# Patient Record
Sex: Male | Born: 1980 | Race: Black or African American | Hispanic: No | Marital: Married | Smoking: Former smoker
Health system: Southern US, Community
[De-identification: ages and names within clinical notes are randomized; demographics above are authoritative.]

---

## 2015-08-22 ENCOUNTER — Encounter: Payer: Self-pay | Admitting: *Deleted

## 2015-08-22 DIAGNOSIS — Z87891 Personal history of nicotine dependence: Secondary | ICD-10-CM | POA: Insufficient documentation

## 2015-08-22 DIAGNOSIS — J159 Unspecified bacterial pneumonia: Secondary | ICD-10-CM | POA: Insufficient documentation

## 2015-08-22 MED ORDER — ACETAMINOPHEN 500 MG PO TABS
1000.0000 mg | ORAL_TABLET | Freq: Once | ORAL | Status: AC
  Administered 2015-08-22: 1000 mg via ORAL

## 2015-08-22 MED ORDER — ACETAMINOPHEN 500 MG PO TABS
ORAL_TABLET | ORAL | Status: AC
  Filled 2015-08-22: qty 2

## 2015-08-22 NOTE — ED Notes (Signed)
Pt reports sinus congestion and fever since yesterday.   Pt also has chills and a dry cough.  cig smoker.

## 2015-08-23 ENCOUNTER — Emergency Department: Payer: Self-pay

## 2015-08-23 ENCOUNTER — Emergency Department
Admission: EM | Admit: 2015-08-23 | Discharge: 2015-08-23 | Disposition: A | Discharge status: Home / Self Care | Payer: Self-pay | Attending: Emergency Medicine | Admitting: Emergency Medicine

## 2015-08-23 DIAGNOSIS — J189 Pneumonia, unspecified organism: Secondary | ICD-10-CM

## 2015-08-23 LAB — RAPID INFLUENZA A&B ANTIGENS (ARMC ONLY)
INFLUENZA A (ARMC): NOT DETECTED
INFLUENZA B (ARMC): NOT DETECTED

## 2015-08-23 MED ORDER — LEVOFLOXACIN 500 MG PO TABS: 500.0000 mg | ORAL_TABLET | Freq: Every day | ORAL | Status: AC

## 2015-08-23 MED ORDER — LEVOFLOXACIN 750 MG PO TABS
750.0000 mg | ORAL_TABLET | Freq: Once | ORAL | Status: AC
  Administered 2015-08-23: 750 mg via ORAL
  Filled 2015-08-23: qty 1

## 2015-08-23 NOTE — ED Notes (Signed)
Pt. States he has had cold/flu like symptoms for the past two days.  Pt. States he has been around another family member who has like symptoms.

## 2015-08-23 NOTE — ED Notes (Signed)
Pt discharged home after verbalizing understanding of discharge instructions; nad noted. 

## 2015-08-23 NOTE — Discharge Instructions (Signed)

## 2015-08-23 NOTE — ED Provider Notes (Signed)
[facility] Emergency Department Provider Note  ____________________________________________  Time seen: 5:00 AM  I have reviewed the triage vital signs and the nursing notes.   HISTORY  Chief Complaint Fever and Nasal Congestion     HPI Andrew Garner is a 35 y.o. male presents with sinus congestion cough fever and chills times one day. Patient's abdomen arrival to the emergency department 101.6. Patient admits to quitting smoking tobacco one month ago prior to that patient states he smoked one pack per day. In addition patient admits to multiple sick contacts at home with similar symptoms.     past medical history  None  There are no active problems to display for this patient.   past surgical history  None  No current outpatient prescriptions on file.  Allergies no known drug allergies  No family history on file.  Social History Social History  Substance Use Topics  . Smoking status: Former Games developer  . Smokeless tobacco: None  . Alcohol Use: Yes    Review of Systems  Constitutional: positive for fever. Eyes: Negative for visual changes. ENT: Negative for sore throat. Cardiovascular: Negative for chest pain. Respiratory: Negative for shortness of breath.Positive for cough Gastrointestinal: Negative for abdominal pain, vomiting and diarrhea. Genitourinary: Negative for dysuria. Musculoskeletal: Negative for back pain. Skin: Negative for rash. Neurological: Negative for headaches, focal weakness or numbness.   10-point ROS otherwise negative.  ____________________________________________   PHYSICAL EXAM:  VITAL SIGNS: ED Triage Vitals  Enc Vitals Group     BP 08/22/15 2233 155/88 mmHg     Pulse Rate 08/22/15 2233 100     Resp 08/22/15 2232 20     Temp 08/22/15 2233 101.6 F (38.7 C)     Temp Source 08/22/15 2232 Oral     SpO2 08/22/15 2233 96 %     Weight 08/22/15 2232 350 lb (158.759 kg)     Height 08/22/15 2232 6' (1.829  m)     Head Cir --      Peak Flow --      Pain Score 08/22/15 2233 8     Pain Loc --      Pain Edu? --      Excl. in GC? --      Constitutional: Alert and oriented. Well appearing and in no distress. Eyes: Conjunctivae are normal. PERRL. Normal extraocular movements. ENT   Head: Normocephalic and atraumatic.   Nose: No congestion/rhinnorhea.   Mouth/Throat: Mucous membranes are moist.   Neck: No stridor. Hematological/Lymphatic/Immunilogical: No cervical lymphadenopathy. Cardiovascular: Normal rate, regular rhythm. Normal and symmetric distal pulses are present in all extremities. No murmurs, rubs, or gallops. Respiratory: Normal respiratory effort without tachypnea nor retractions. Breath sounds are clear and equal bilaterally. No wheezes/rales/rhonchi. Gastrointestinal: Soft and nontender. No distention. There is no CVA tenderness. Genitourinary: deferred Musculoskeletal: Nontender with normal range of motion in all extremities. No joint effusions.  No lower extremity tenderness nor edema. Neurologic:  Normal speech and language. No gross focal neurologic deficits are appreciated. Speech is normal.  Skin:  Skin is warm, dry and intact. No rash noted. Psychiatric: Mood and affect are normal. Speech and behavior are normal. Patient exhibits appropriate insight and judgment.  ____________________________________________    LABS (pertinent positives/negatives)  Labs Reviewed  RAPID INFLUENZA A&B ANTIGENS ([facility] ONLY)      RADIOLOGY CT Chest Wo Contrast (Final result) Result time: 08/23/15 07:12:20   Final result by Rad Results In Interface (08/23/15 07:12:20)   Narrative:   CLINICAL  DATA: 35 year old male with cold and flu like symptoms for 2 days. Sick contacts. Abnormal left lung on chest x-ray. Initial encounter.  EXAM: CT CHEST WITHOUT CONTRAST  TECHNIQUE: Multidetector CT imaging of the chest was performed following the standard protocol without IV  contrast.  COMPARISON: Chest x-ray 0509 hours today.  FINDINGS: Large body habitus. 6 cm area of confluent peri-bronchovascular opacity with consolidation and air bronchograms (series 3, image 22). Airspace disease extends from near the left hilum to the peripheral pleural surface, affecting the left upper lobe exclusively. No left pleural effusion.  Major airways are patent. There is bronchiectasis in the right lower lobe with gas trapping (series 3, image 43). Borderline to mild bronchiectasis in the left lower lobe. No other abnormal pulmonary opacity.  No pericardial effusion. No mediastinal lymphadenopathy. Negative non contrast thoracic inlet. No axillary lymphadenopathy. Negative non contrast liver, gallbladder, spleen, pancreas, adrenal glands, kidneys, and bowel in the upper abdomen.  No acute osseous abnormality identified.  IMPRESSION: 1. Left upper lobe airspace disease most compatible with bronchopneumonia, round pneumonia. No associated pleural effusion. Post treatment follow-up PA and lateral chest X-ray is recommended (e.g. In 3-4 weeks) to document resolution. 2. Right lower lobe bronchiectasis. Associated gas trapping.   Electronically Signed By: Odessa Fleming M.D. On: 08/23/2015 07:12          DG Chest 2 View (Final result) Result time: 08/23/15 05:54:47   Final result by Rad Results In Interface (08/23/15 05:54:47)   Narrative:   CLINICAL DATA: Cough and fever for 2 days.  EXAM: CHEST 2 VIEW  COMPARISON: None.  FINDINGS: There is a focal opacity in the left upper lobe. The appearance is rounded and masslike in the frontal projection but more elongated when seen laterally. No cavitation or effusion. Normal heart size and aortic contours.  IMPRESSION: Left upper lobe opacity is presumably pneumonia given the history, but has a masslike appearance. Followup PA and lateral chest X-ray is recommended in 3-4 weeks following trial of  antibiotic therapy to ensure resolution and exclude malignancy.   Electronically Signed By: Marnee Spring M.D. On: 08/23/2015 05:53          INITIAL IMPRESSION / ASSESSMENT AND PLAN / ED COURSE  Pertinent labs & imaging results that were available during my care of the patient were reviewed by me and considered in my medical decision making (see chart for details).    ____________________________________________   FINAL CLINICAL IMPRESSION(S) / ED DIAGNOSES  Final diagnoses:  Community acquired pneumonia      Darci Current, MD 08/28/15 [phone]

## 2015-08-23 NOTE — ED Notes (Signed)
Patient transported to X-ray 

## 2015-08-23 NOTE — ED Notes (Signed)
Pt. Weighed for CT scan pt. Is 395 lbs.

## 2015-08-23 NOTE — ED Provider Notes (Signed)
-----------------------------------------   7:22 AM on 08/23/2015 -----------------------------------------  CT read most consistent with pneumonia. I discussed with the patient the need to follow-up with his primary care doctor to obtain a repeat chest x-ray in 4-6 weeks to ensure resolution. Patient agreeable. We'll discharge home on antibiotics per Dr. Theora Gianotti discharge instructions.  Minna Antis, MD 08/23/15 [phone]

## 2016-09-22 IMAGING — CT CT CHEST W/O CM
2 of 4 series · 15 of 36 positions shown, 18 images · non-contrast
Comparison: Chest x-ray 8682 hours today.

CLINICAL DATA: 34-year-old male with cold and flu like symptoms for
2 days. Sick contacts. Abnormal left lung on chest x-ray. Initial
encounter.

EXAM:
CT CHEST WITHOUT CONTRAST
TECHNIQUE: Multidetector CT imaging of the chest was performed following the
standard protocol without IV contrast.

[Series 2: routine chest wo · axial · 0.81mm/px · z∈[+47,+322]mm · 12 of 65 slices shown, 15 images]
[im 5/65  mediastinal]
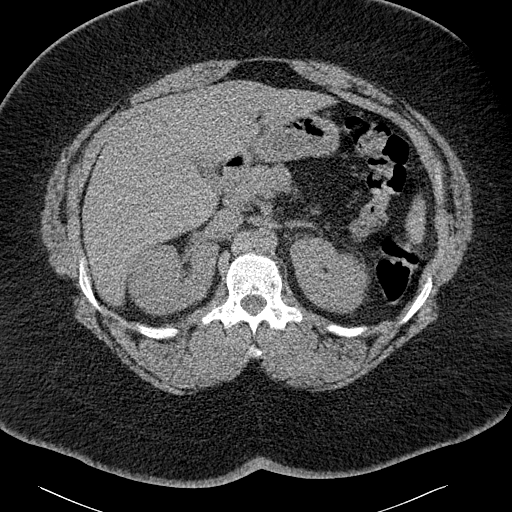
[im 5/65  lung]
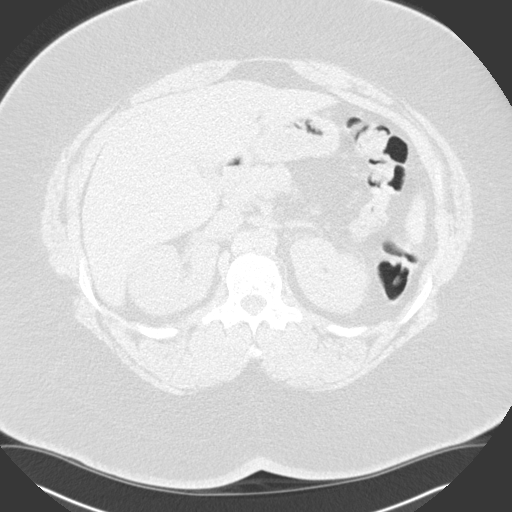
[im 10/65  lung]
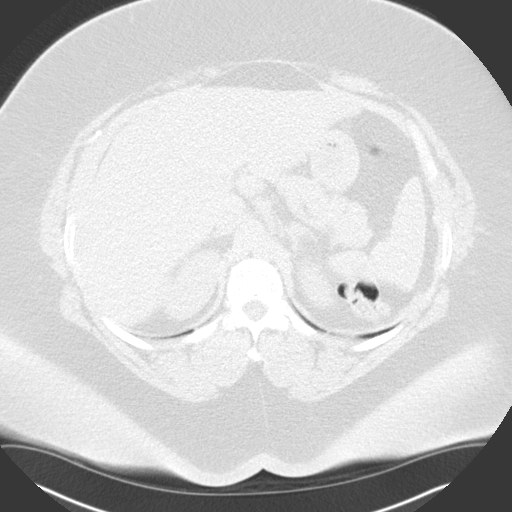
[im 15/65  lung]
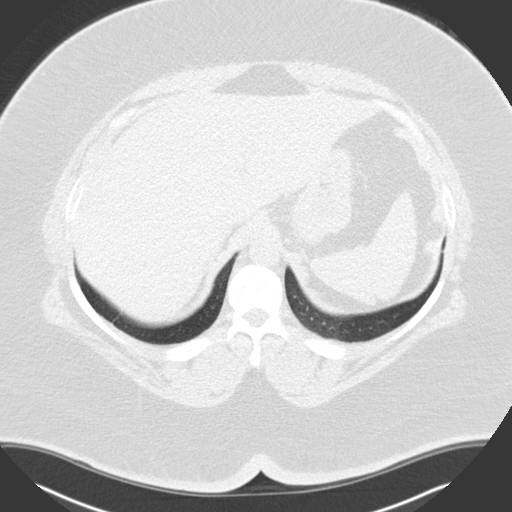
[im 20/65  lung]
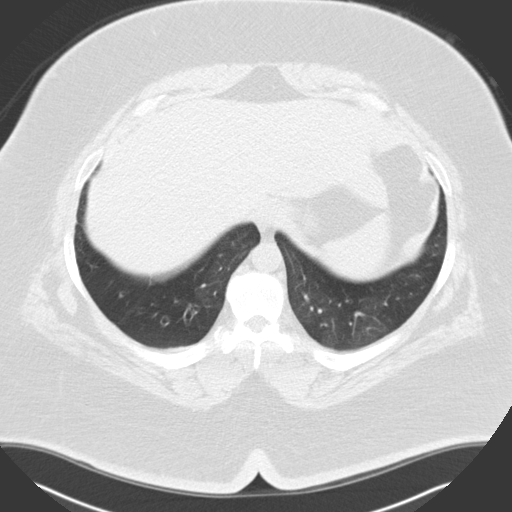
[im 25/65  mediastinal]
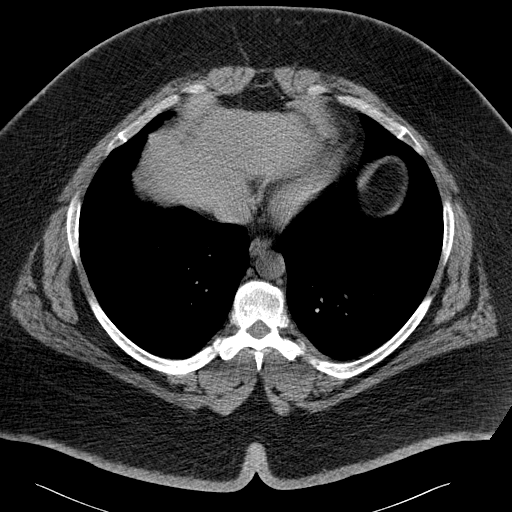
[im 25/65  lung]
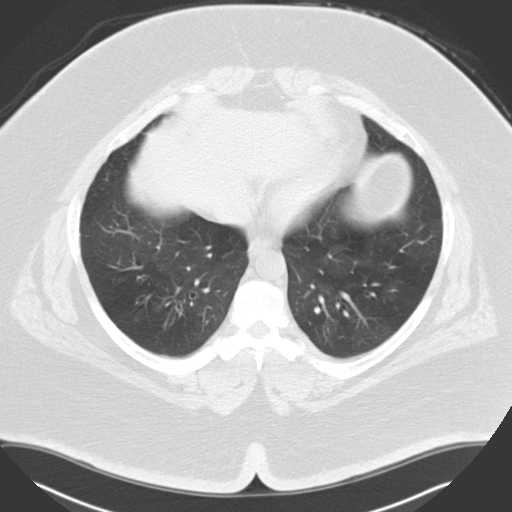
[im 30/65  lung]
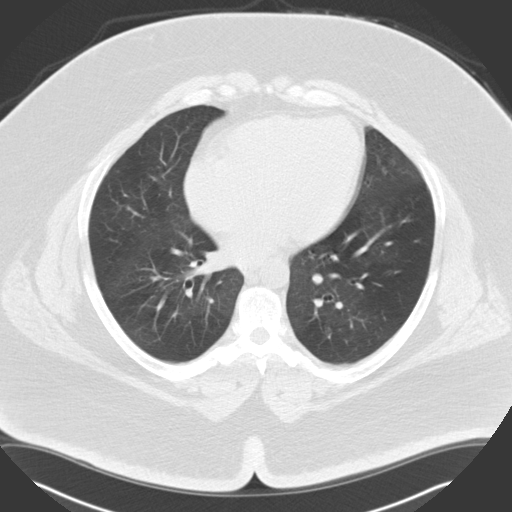
[im 35/65  lung]
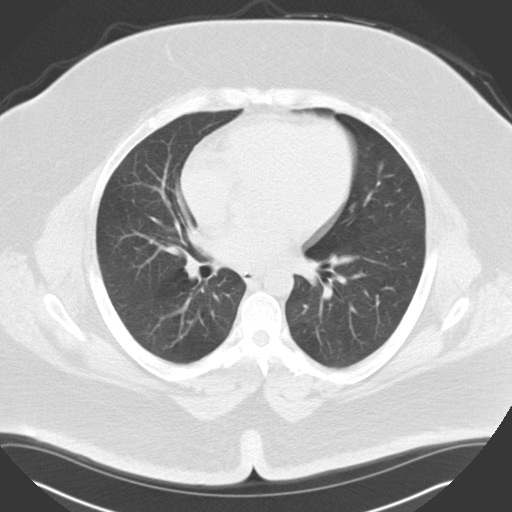
[im 40/65  lung]
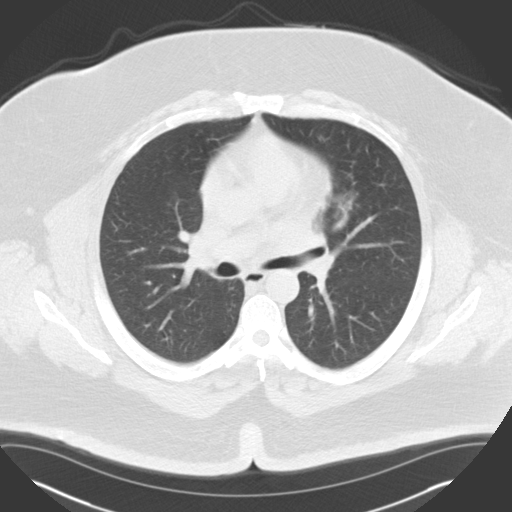
[im 45/65  mediastinal]
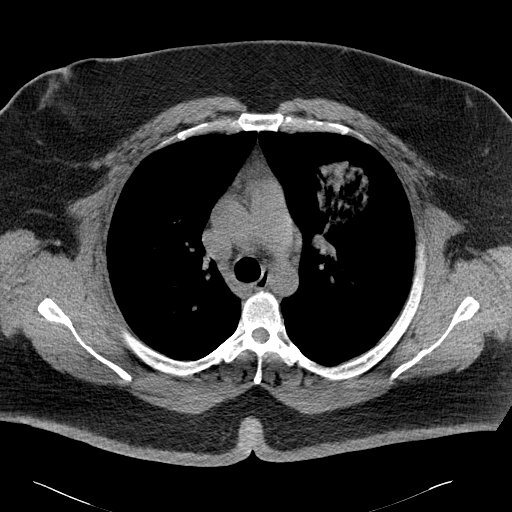
[im 45/65  lung]
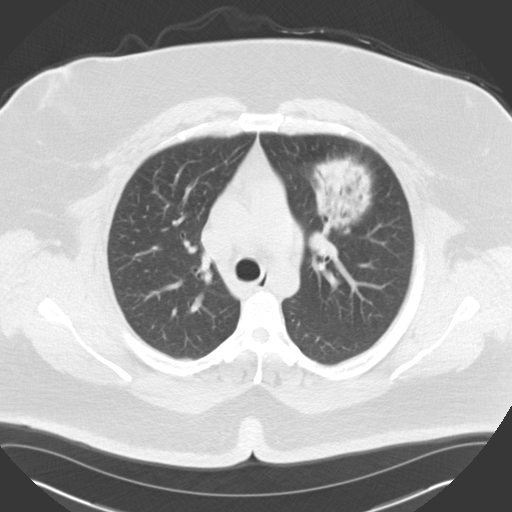
[im 50/65  lung]
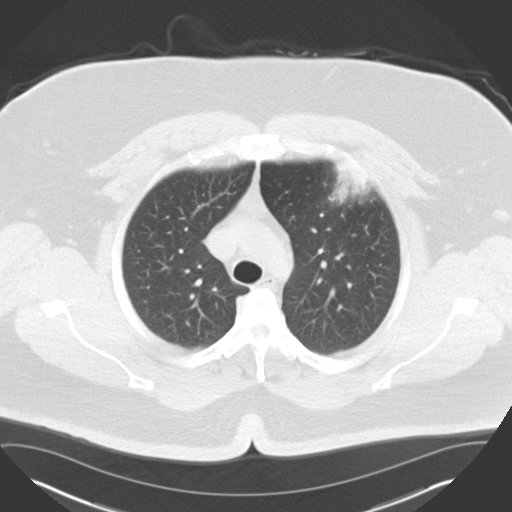
[im 55/65  lung]
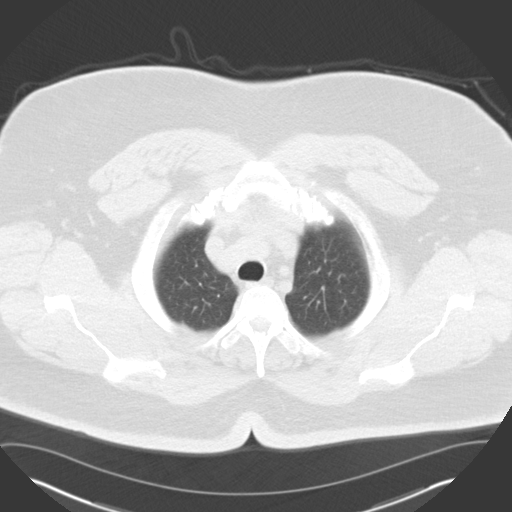
[im 60/65  lung]
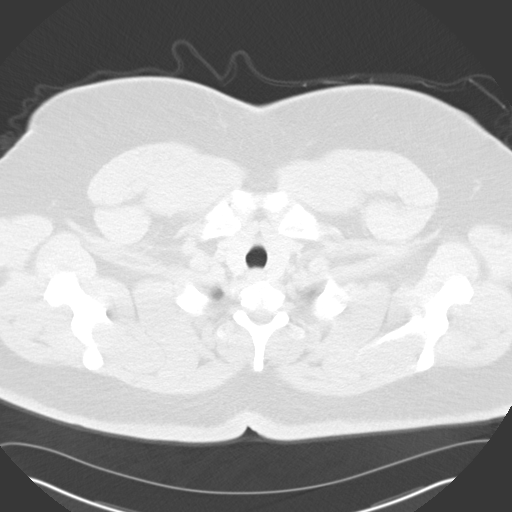

[Series 5: cor routine chest wo · coronal · 0.68mm/px · 3 of 148 slices shown]
[im 30/148  lung]
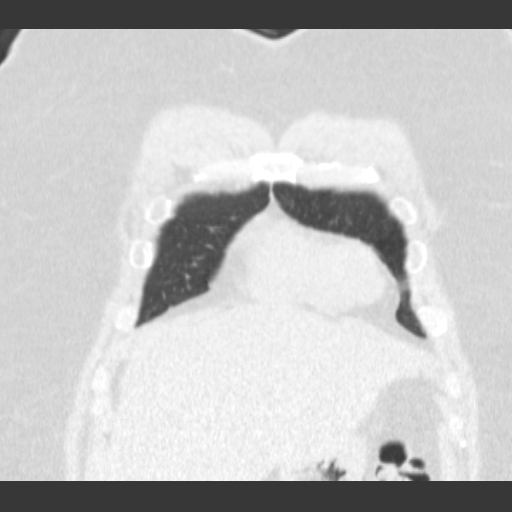
[im 59/148  lung]
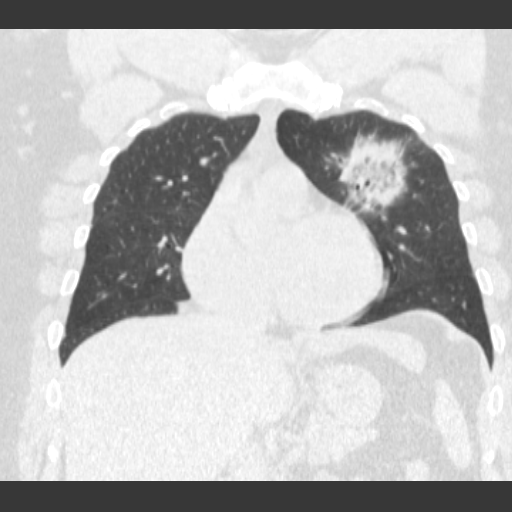
[im 89/148  lung]
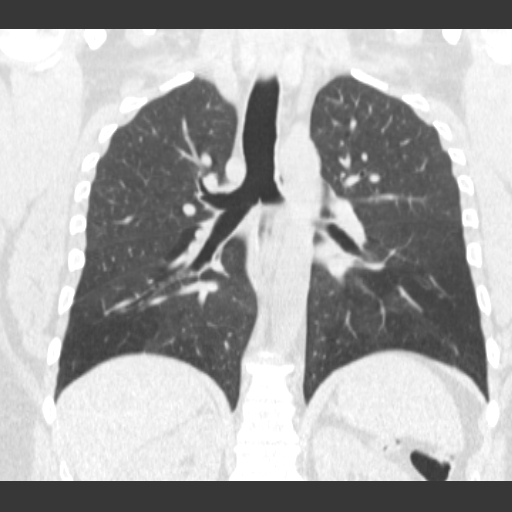

[15 of 36 positions shown; findings below may reference images not displayed]

FINDINGS: Large body habitus. 6 cm area of confluent peri-bronchovascular
opacity with consolidation and air bronchograms (series 3, image
22). Airspace disease extends from near the left hilum to the
peripheral pleural surface, affecting the left upper lobe
exclusively. No left pleural effusion.

Major airways are patent. There is bronchiectasis in the right lower
lobe with gas trapping (series 3, image 43). Borderline to mild
bronchiectasis in the left lower lobe. No other abnormal pulmonary
opacity.

No pericardial effusion. No mediastinal lymphadenopathy. Negative
non contrast thoracic inlet. No axillary lymphadenopathy. Negative
non contrast liver, gallbladder, spleen, pancreas, adrenal glands,
kidneys, and bowel in the upper abdomen.

No acute osseous abnormality identified.
IMPRESSION: 1. Left upper lobe airspace disease most compatible with
bronchopneumonia, round pneumonia. No associated pleural effusion.
Post treatment follow-up PA and lateral chest X-ray is recommended
(e.g. In 3-4 weeks) to document resolution.
2. Right lower lobe bronchiectasis.  Associated gas trapping.

## 2018-04-26 DIAGNOSIS — F1210 Cannabis abuse, uncomplicated: Secondary | ICD-10-CM | POA: Insufficient documentation

## 2018-08-27 DIAGNOSIS — F1210 Cannabis abuse, uncomplicated: Secondary | ICD-10-CM
